# Patient Record
Sex: Male | Born: 1990 | Race: White | Hispanic: No | Marital: Single | Smoking: Never smoker
Health system: Southern US, Community
[De-identification: ages and names within clinical notes are randomized; demographics above are authoritative.]

## PROBLEM LIST (undated history)

## (undated) DIAGNOSIS — E232 Diabetes insipidus: Secondary | ICD-10-CM

## (undated) HISTORY — PX: APPENDECTOMY: SHX54

---

## 2013-05-21 ENCOUNTER — Emergency Department (HOSPITAL_COMMUNITY)
Admission: EM | Admit: 2013-05-21 | Discharge: 2013-05-21 | Discharge status: Against Medical Advice | Payer: 59 | Attending: Emergency Medicine | Admitting: Emergency Medicine

## 2013-05-21 ENCOUNTER — Encounter (HOSPITAL_COMMUNITY): Payer: Self-pay | Admitting: Emergency Medicine

## 2013-05-21 DIAGNOSIS — T7840XA Allergy, unspecified, initial encounter: Secondary | ICD-10-CM

## 2013-05-21 DIAGNOSIS — R21 Rash and other nonspecific skin eruption: Secondary | ICD-10-CM | POA: Insufficient documentation

## 2013-05-21 DIAGNOSIS — T364X5A Adverse effect of tetracyclines, initial encounter: Secondary | ICD-10-CM | POA: Insufficient documentation

## 2013-05-21 HISTORY — DX: Diabetes insipidus: E23.2

## 2013-05-21 NOTE — ED Notes (Signed)
Pt reports starting doxycycline on Tuesday of last week and stopped taking it on Saturday. Pt noted that he has white patches to his throat and a rash to his eyes. Pt speaks as if he is nasal congested and has redness around the eye area. Pt alert and oriented. Pt reports soreness to throat and has normal ability to swallow. Pt denies any breathing difficulty.

## 2013-07-20 ENCOUNTER — Encounter: Payer: 59 | Admitting: Sports Medicine

## 2013-07-22 ENCOUNTER — Ambulatory Visit (INDEPENDENT_AMBULATORY_CARE_PROVIDER_SITE_OTHER): Payer: 59 | Admitting: Emergency Medicine

## 2013-07-22 ENCOUNTER — Encounter: Payer: Self-pay | Admitting: Emergency Medicine

## 2013-07-22 ENCOUNTER — Encounter (INDEPENDENT_AMBULATORY_CARE_PROVIDER_SITE_OTHER): Payer: Self-pay

## 2013-07-22 VITALS — BP 119/71 | Ht 75.0 in | Wt 185.0 lb

## 2013-07-22 DIAGNOSIS — M7650 Patellar tendinitis, unspecified knee: Secondary | ICD-10-CM

## 2013-07-22 DIAGNOSIS — M2141 Flat foot [pes planus] (acquired), right foot: Secondary | ICD-10-CM

## 2013-07-22 DIAGNOSIS — M2142 Flat foot [pes planus] (acquired), left foot: Principal | ICD-10-CM

## 2013-07-22 DIAGNOSIS — M2140 Flat foot [pes planus] (acquired), unspecified foot: Secondary | ICD-10-CM

## 2013-07-22 NOTE — Progress Notes (Signed)
Patient ID: Trevor Ellison, male   DOB: 10/24/1990, 23 y.o.   MRN: [id number] 23 year old male [employer] who presents for custom orthotics today. Has been followed by Dr. Thurston HoleWainer for persistent knee pain. He developed issues with patellar tendinitis every season. Gets progressively worse as the season goes on. A history of pes planus. He was referred from Dr. Thurston HoleWainer for evaluation production of custom orthotics to improve his pes planus and excessive pronation in hopes that this will alleviate his patellar tendinitis issues.  Pertinent past medical history: Patellar tendinitis  Social history: Nonsmoker, occasional alcohol  Family history is noncontributory  Review of systems as per history of present illness otherwise all systems negative  Examination: BP 119/71  Ht 6\' 3"  (1.905 m)  Wt 185 lb (83.915 kg)  BMI 23.12 kg/m2 Well-developed well-nourished 23 year old male awake alert and oriented in no acute distress Bilateral knees with minimal tenderness the region the patellar tendon at today's visit. Bilateral feet: Pes planus with excessive pronation. No evidence of bunion deformity. Calcaneal valgus with toe stance. Leg length: Right leg less than 0.5 cm longer than the left.  Patient was fitted for a : standard, cushioned, semi-rigid orthotic. The orthotic was heated and afterward the patient stood on the orthotic blank positioned on the orthotic stand. The patient was positioned in subtalar neutral position and 10 degrees of ankle dorsiflexion in a weight bearing stance. After completion of molding, a stable base was applied to the orthotic blank. The blank was ground to a stable position for weight bearing. Size: 12 Base: blue eva Posting: none Additional orthotic padding: none  Gait post orthotic production shows less excessive pronation and neutral running gait.  Greater than 45 minutes of time was spent in production of the custom orthotics including obtaining  history, physical examination and gait evaluation.

## 2014-06-25 ENCOUNTER — Emergency Department (HOSPITAL_COMMUNITY)
Admission: EM | Admit: 2014-06-25 | Discharge: 2014-06-25 | Disposition: A | Discharge status: Home / Self Care | Payer: 59 | Attending: Emergency Medicine | Admitting: Emergency Medicine

## 2014-06-25 ENCOUNTER — Emergency Department (HOSPITAL_COMMUNITY): Payer: 59

## 2014-06-25 ENCOUNTER — Encounter (HOSPITAL_COMMUNITY): Payer: Self-pay | Admitting: Emergency Medicine

## 2014-06-25 DIAGNOSIS — Y998 Other external cause status: Secondary | ICD-10-CM | POA: Insufficient documentation

## 2014-06-25 DIAGNOSIS — Y9365 Activity, lacrosse and field hockey: Secondary | ICD-10-CM | POA: Diagnosis not present

## 2014-06-25 DIAGNOSIS — Y9239 Other specified sports and athletic area as the place of occurrence of the external cause: Secondary | ICD-10-CM | POA: Diagnosis not present

## 2014-06-25 DIAGNOSIS — Z79899 Other long term (current) drug therapy: Secondary | ICD-10-CM | POA: Insufficient documentation

## 2014-06-25 DIAGNOSIS — S8991XA Unspecified injury of right lower leg, initial encounter: Secondary | ICD-10-CM | POA: Diagnosis present

## 2014-06-25 DIAGNOSIS — S8011XA Contusion of right lower leg, initial encounter: Secondary | ICD-10-CM

## 2014-06-25 DIAGNOSIS — E232 Diabetes insipidus: Secondary | ICD-10-CM | POA: Insufficient documentation

## 2014-06-25 DIAGNOSIS — W1839XA Other fall on same level, initial encounter: Secondary | ICD-10-CM | POA: Insufficient documentation

## 2014-06-25 DIAGNOSIS — Z880 Allergy status to penicillin: Secondary | ICD-10-CM | POA: Diagnosis not present

## 2014-06-25 LAB — COMPREHENSIVE METABOLIC PANEL
ALK PHOS: 124 U/L — AB (ref 39–117)
ALT: 32 U/L (ref 0–53)
AST: 61 U/L — AB (ref 0–37)
Albumin: 4.5 g/dL (ref 3.5–5.2)
Anion gap: 7 (ref 5–15)
BUN: 20 mg/dL (ref 6–23)
CALCIUM: 9.1 mg/dL (ref 8.4–10.5)
CO2: 25 mmol/L (ref 19–32)
CREATININE: 0.87 mg/dL (ref 0.50–1.35)
Chloride: 92 mmol/L — ABNORMAL LOW (ref 96–112)
GFR calc non Af Amer: >90 mL/min (ref >90)
Glucose, Bld: 94 mg/dL (ref 70–99)
Potassium: 4.6 mmol/L (ref 3.5–5.1)
Sodium: 124 mmol/L — ABNORMAL LOW (ref 135–145)
Total Bilirubin: 0.6 mg/dL (ref 0.3–1.2)
Total Protein: 7.1 g/dL (ref 6.0–8.3)

## 2014-06-25 LAB — CBC
HCT: 33.2 % — ABNORMAL LOW (ref 39.0–52.0)
Hemoglobin: 11.1 g/dL — ABNORMAL LOW (ref 13.0–17.0)
MCH: 28.2 pg (ref 26.0–34.0)
MCHC: 33.4 g/dL (ref 30.0–36.0)
MCV: 84.5 fL (ref 78.0–100.0)
Platelets: 230 10*3/uL (ref 150–400)
RBC: 3.93 MIL/uL — ABNORMAL LOW (ref 4.22–5.81)
RDW: 12 % (ref 11.5–15.5)
WBC: 9 10*3/uL (ref 4.0–10.5)

## 2014-06-25 MED ORDER — OXYCODONE-ACETAMINOPHEN 5-325 MG PO TABS
2.0000 | ORAL_TABLET | Freq: Once | ORAL | Status: AC
Start: 2014-06-25 — End: 2014-06-25
  Administered 2014-06-25: 2 via ORAL
  Filled 2014-06-25: qty 2

## 2014-06-25 MED ORDER — OXYCODONE-ACETAMINOPHEN 5-325 MG PO TABS
2.0000 | ORAL_TABLET | ORAL | Status: AC | PRN
Start: 2014-06-25 — End: ?

## 2014-06-25 MED ORDER — NAPROXEN 500 MG PO TABS: 500.0000 mg | ORAL_TABLET | Freq: Two times a day (BID) | ORAL | Status: AC

## 2014-06-25 NOTE — ED Notes (Signed)
Awake. Verbally responsive. A/O x4. Resp even and unlabored. No audible adventitious breath sounds noted. ABC's intact. Rt lower leg swelling, (+)PMS, CRT brisk, no deformity/bruising noted, ROM, and able to bear weight. Family at bedside.

## 2014-06-25 NOTE — ED Notes (Signed)
Pt reports playing lacrosse today and landed on right leg. Pt has visible swelling to right leg. Pt has hx of diabetes inspidus.

## 2014-06-25 NOTE — Discharge Instructions (Signed)
Contusion Please return for increased swelling, numbness and tingling, or increased pain. [facility]. Take medications as prescribed.  Apply Ice, rest, and elevate the leg.  A contusion is a deep bruise. Contusions are the result of an injury that caused bleeding under the skin. The contusion may turn blue, purple, or yellow. Minor injuries will give you a painless contusion, but more severe contusions may stay painful and swollen for a few weeks.  CAUSES  A contusion is usually caused by a blow, trauma, or direct force to an area of the body. SYMPTOMS   Swelling and redness of the injured area.  Bruising of the injured area.  Tenderness and soreness of the injured area.  Pain. DIAGNOSIS  The diagnosis can be made by taking a history and physical exam. An X-ray, CT scan, or MRI may be needed to determine if there were any associated injuries, such as fractures. TREATMENT  Specific treatment will depend on what area of the body was injured. In general, the best treatment for a contusion is resting, icing, elevating, and applying cold compresses to the injured area. Over-the-counter medicines may also be recommended for pain control. Ask your caregiver what the best treatment is for your contusion. HOME CARE INSTRUCTIONS   Put ice on the injured area.  Put ice in a plastic bag.  Place a towel between your skin and the bag.  Leave the ice on for 15-20 minutes, 3-4 times a day, or as directed by your health care provider.  Only take over-the-counter or prescription medicines for pain, discomfort, or fever as directed by your caregiver. Your caregiver may recommend avoiding anti-inflammatory medicines (aspirin, ibuprofen, and naproxen) for 48 hours because these medicines may increase bruising.  Rest the injured area.  If possible, elevate the injured area to reduce swelling. SEEK IMMEDIATE MEDICAL CARE IF:   You have increased bruising or swelling.  You  have pain that is getting worse.  Your swelling or pain is not relieved with medicines. MAKE SURE YOU:   Understand these instructions.  Will watch your condition.  Will get help right away if you are not doing well or get worse. Document Released: 12/06/2004 Document Revised: 03/03/2013 Document Reviewed: 01/01/2011 [facility] Patient Information 2015 [place], [place]. This information is not intended to replace advice given to you by your health care provider. Make sure you discuss any questions you have with your health care provider.

## 2014-06-25 NOTE — ED Provider Notes (Signed)
CSN: [id number]     Arrival date & time 06/25/14  2028 History   First MD Initiated Contact with Patient 06/25/14 2230     Chief Complaint  Patient presents with  . Leg Swelling     (Consider location/radiation/quality/duration/timing/severity/associated sxs/prior Treatment) The history is provided by the patient. No language interpreter was used.  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  He states he was able to finish practice but the pain intensified as time went one.  He felt that the pain was out of proportion who the mechanism of the injury. He spoke to his trainer who told him to get it checked out. No treatment prior to arrival. He denies any fever, numbness, or tingling.   Past Medical History  Diagnosis Date  . Diabetes insipidus    Past Surgical History  Procedure Laterality Date  . Appendectomy     History reviewed. No pertinent family history. History  Substance Use Topics  . Smoking status: Never Smoker   . Smokeless tobacco: Not on file  . Alcohol Use: Yes    Review of Systems  Gastrointestinal: Negative for nausea and vomiting.  Musculoskeletal: Negative for joint swelling and arthralgias.  Skin: Negative for wound.  Hematological: Does not bruise/bleed easily.      Allergies  Penicillins  Home Medications   Prior to Admission medications   Medication Sig Start Date End Date Taking? Authorizing Provider  desmopressin (DDAVP NASAL) 0.01 % solution  05/12/13   Historical Provider, MD  desmopressin (DDAVP) 0.1 MG tablet Take 0.1 mg by mouth 2 (two) times daily.    Historical Provider, MD  diphenhydrAMINE (BENADRYL) 25 mg capsule Take 25 mg by mouth every 6 (six) hours as needed for allergies.    Historical Provider, MD  doxycycline (VIBRAMYCIN) 100 MG capsule  05/09/13   Historical Provider, MD  naproxen (NAPROSYN) 500 MG tablet Take 1 tablet (500 mg total) by mouth 2  (two) times daily. 06/25/14   Trevor Dam Patel-Mills, PA-C  oxyCODONE-acetaminophen (PERCOCET/ROXICET) 5-325 MG per tablet Take 2 tablets by mouth every 4 (four) hours as needed for severe pain. 06/25/14   Trevor Puckett Patel-Mills, PA-C   BP 157/82 mmHg  Pulse 71  Temp(Src) 98.8 F (37.1 C) (Oral)  Resp 19  SpO2 98% Physical Exam  Constitutional: He is oriented to person, place, and time. He appears well-developed and well-nourished.  Cardiovascular: Normal rate, regular rhythm and normal heart sounds.   Pulmonary/Chest: Effort normal and breath sounds normal.  Musculoskeletal:  Right lower leg swelling, erythema, and tenderness to palpation below the knee and along the tibial plateau.    Good DP and PT pulses. Able to ambulate. Able to extend and flex the knee without difficulty.  No pain on hyperextension of the 1st toe.  Good sensation in the toes and foot.   Neurological: He is alert and oriented to person, place, and time.  Skin: Skin is warm and dry.  Nursing note and vitals reviewed.   ED Course  Procedures (including critical care time) Labs Review Labs Reviewed  CBC - Abnormal; Notable for the following:    RBC 3.93 (*)    Hemoglobin 11.1 (*)    HCT 33.2 (*)    All other components within normal limits  COMPREHENSIVE METABOLIC PANEL - Abnormal; Notable for the following:    Sodium 124 (*)    Chloride 92 (*)  AST 61 (*)    Alkaline Phosphatase 124 (*)    All other components within normal limits    Imaging Review Dg Tibia/fibula Right  06/25/2014   CLINICAL DATA:  Lacrosse injury, falling onto the lower leg. Swelling and redness.  EXAM: RIGHT TIBIA AND FIBULA - 2 VIEW  COMPARISON:  None.  FINDINGS: There is no evidence of fracture or other focal bone lesions. Soft tissues are unremarkable.  IMPRESSION: Normal   Electronically Signed   By: Paulina FusiMark  Shogry M.D.   On: 06/25/2014 21:34     EKG Interpretation None      MDM   Final diagnoses:  Contusion of right leg, initial  encounter   Patient presents for right leg pain after fall at lacrosse practice 4 hours ago.   He is afebrile with good pulses. He has some soft tissue swelling, erythema, and tenderness to palpation just below the knee but the skin is able to be compressed. Good ROM of right leg. Cap refill < 2 seconds.  Leg is warm to the touch. He is able to ambulate on the leg.  I do not suspect compartment syndrome at this time.  He has a negative xray of the right leg.   He has hyponatremia which he is aware of says he is overdue by 2 hours for his medication.  He has DI. He is also slightly anemic which I also discussed with him.   I discussed returning to the ED if he had increased pain and swelling in the leg. He is to rest, ice, and elevate the leg. [facility] and mom and patient agree with the plan.     Trevor GosselinHanna Patel-Mills, PA-C 06/26/14 1212  Elwin MochaBlair Walden, MD 06/28/14 [phone]

## 2016-11-04 IMAGING — CR DG TIBIA/FIBULA 2V*R*
4 series · 4 of 4 positions shown · non-contrast
Comparison: None.

CLINICAL DATA: Lacrosse injury, falling onto the lower leg.
Swelling and redness.

EXAM:
RIGHT TIBIA AND FIBULA - 2 VIEW

[t tib-fib ap right (1 of 2)]
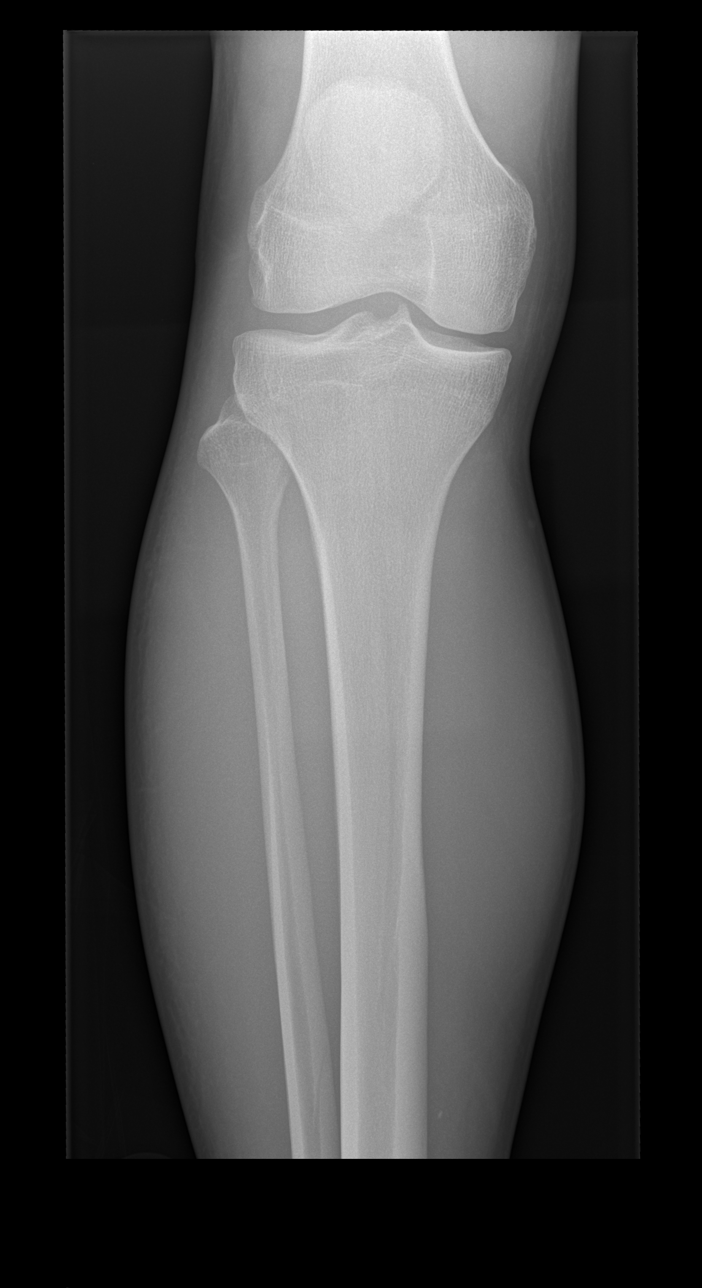

[t tib-fib ap right (2 of 2)]
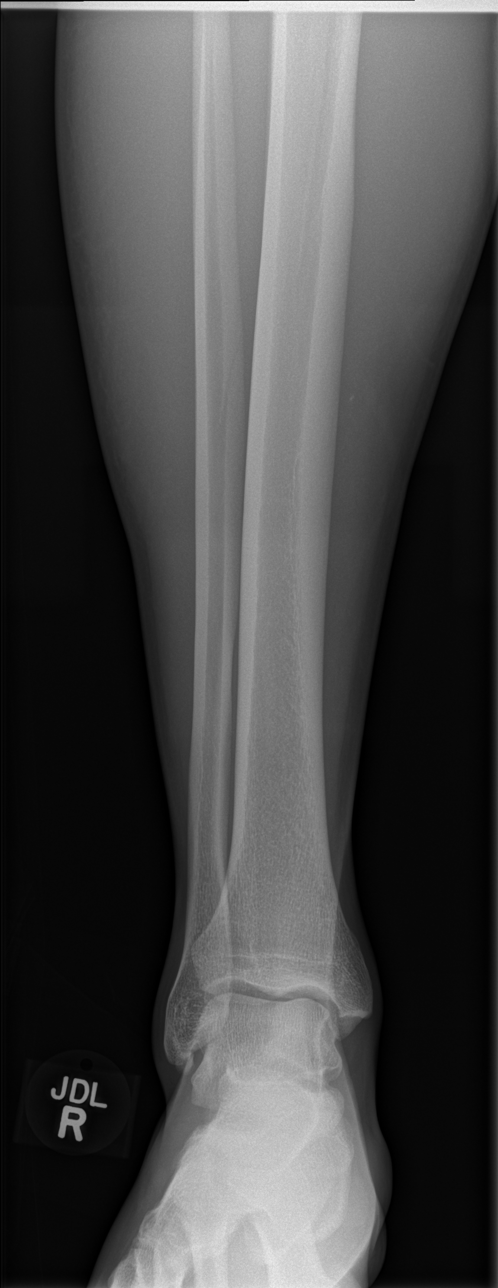

[t tib-fib lat right (1 of 2)]
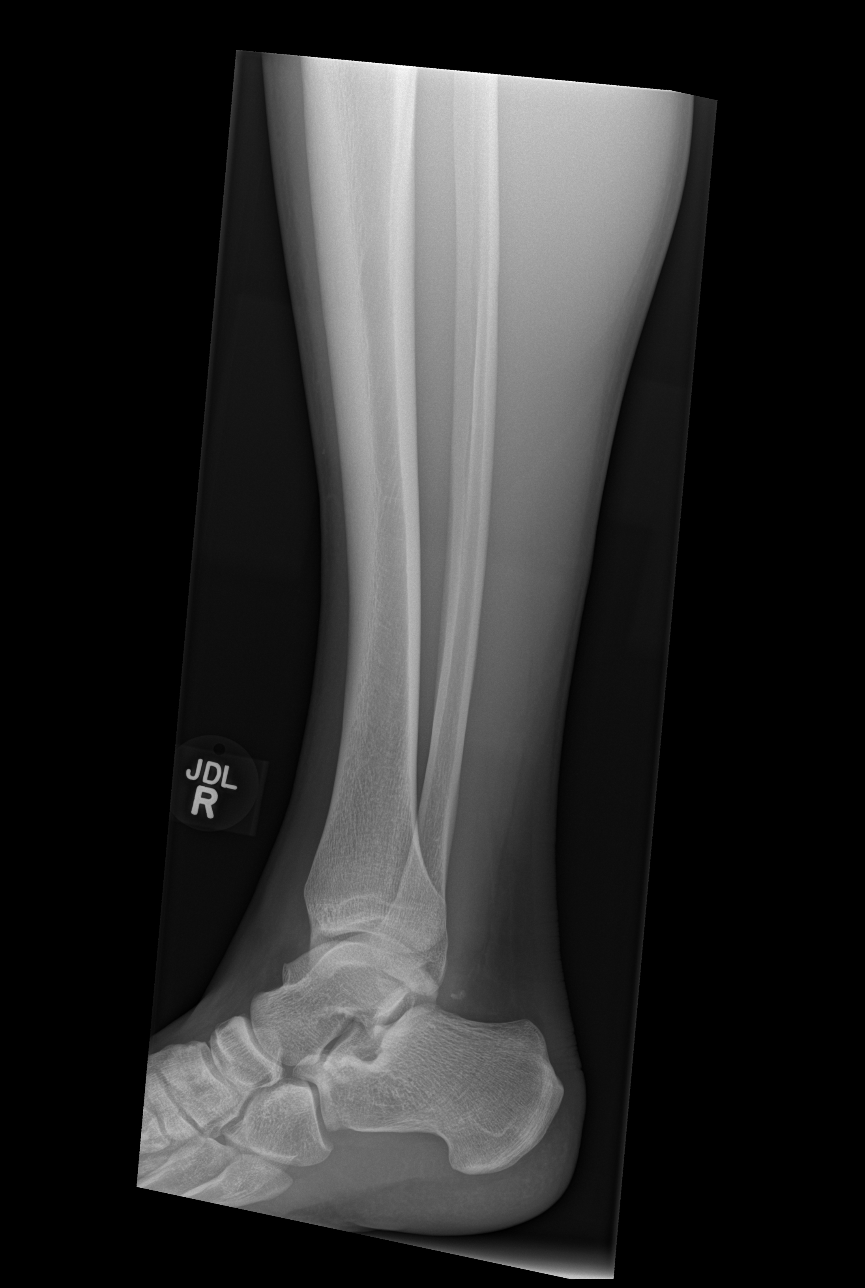

[t tib-fib lat right (2 of 2)]
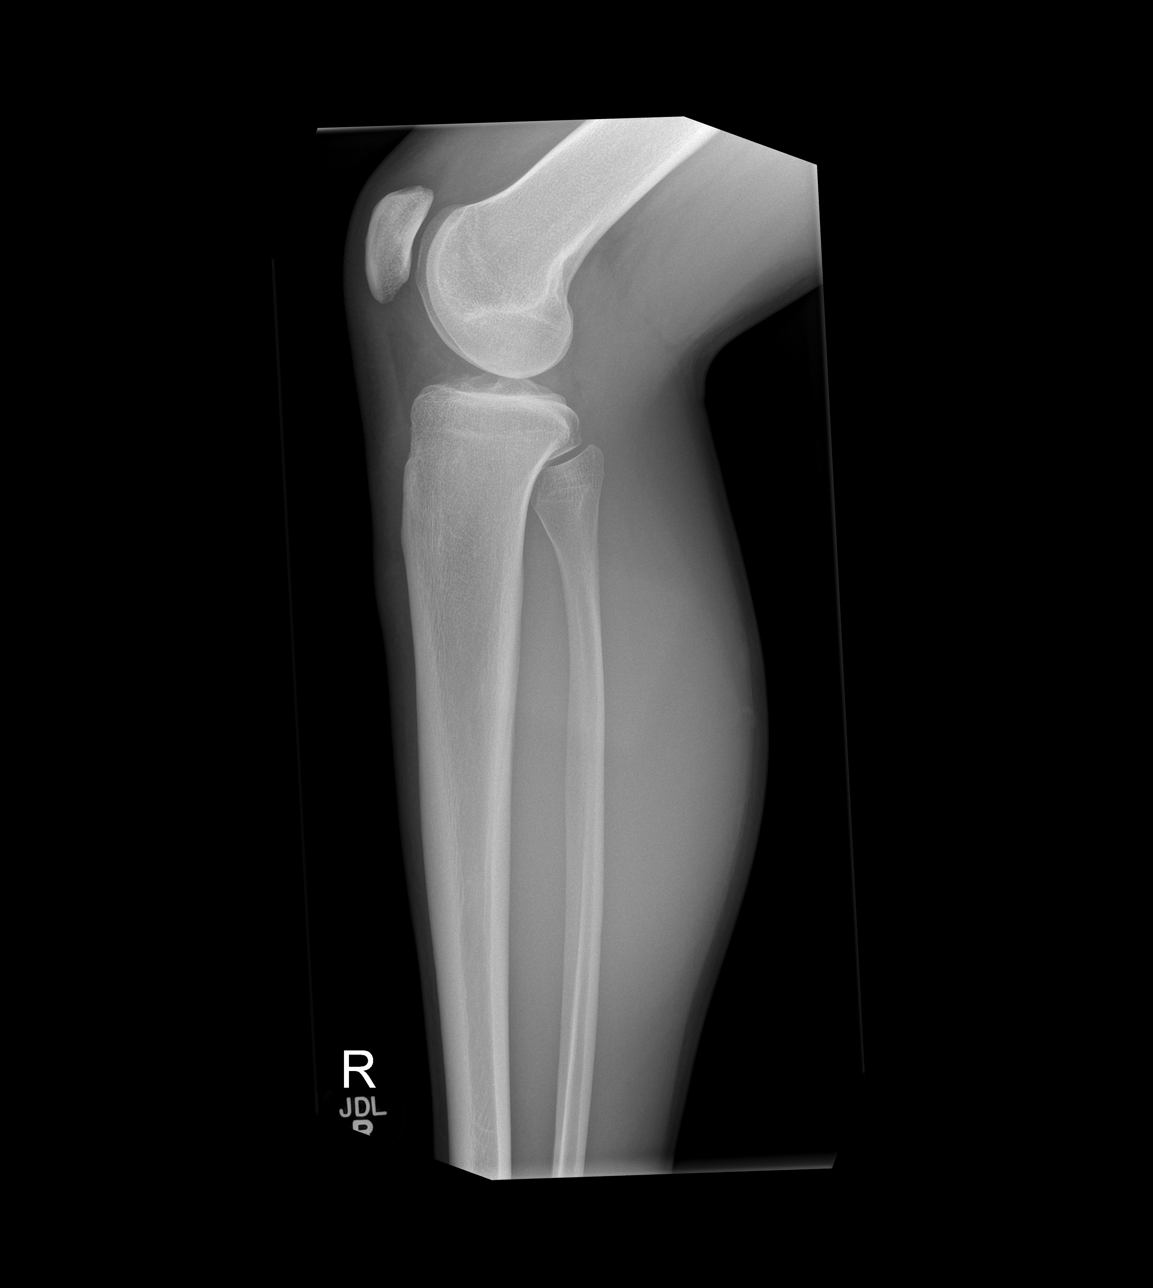

[4 of 4 positions shown; findings below may reference images not displayed]

FINDINGS: There is no evidence of fracture or other focal bone lesions. Soft
tissues are unremarkable.
IMPRESSION: Normal
